# Patient Record
Sex: Male | Born: 2005 | Race: White | Hispanic: No | Marital: Single | Smoking: Never smoker
Health system: Southern US, Community
[De-identification: ages and names within clinical notes are randomized; demographics above are authoritative.]

## PROBLEM LIST (undated history)

## (undated) ENCOUNTER — Ambulatory Visit: Admission: EM | Payer: 59 | Source: Home / Self Care

---
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
## 2023-10-06 IMAGING — CR DG CHEST 2V
2 series · 2 of 2 positions shown · non-contrast
Comparison: 12/06/2009

CLINICAL DATA: Worsening asthma

EXAM:
CHEST - 2 VIEW

[chest pa]
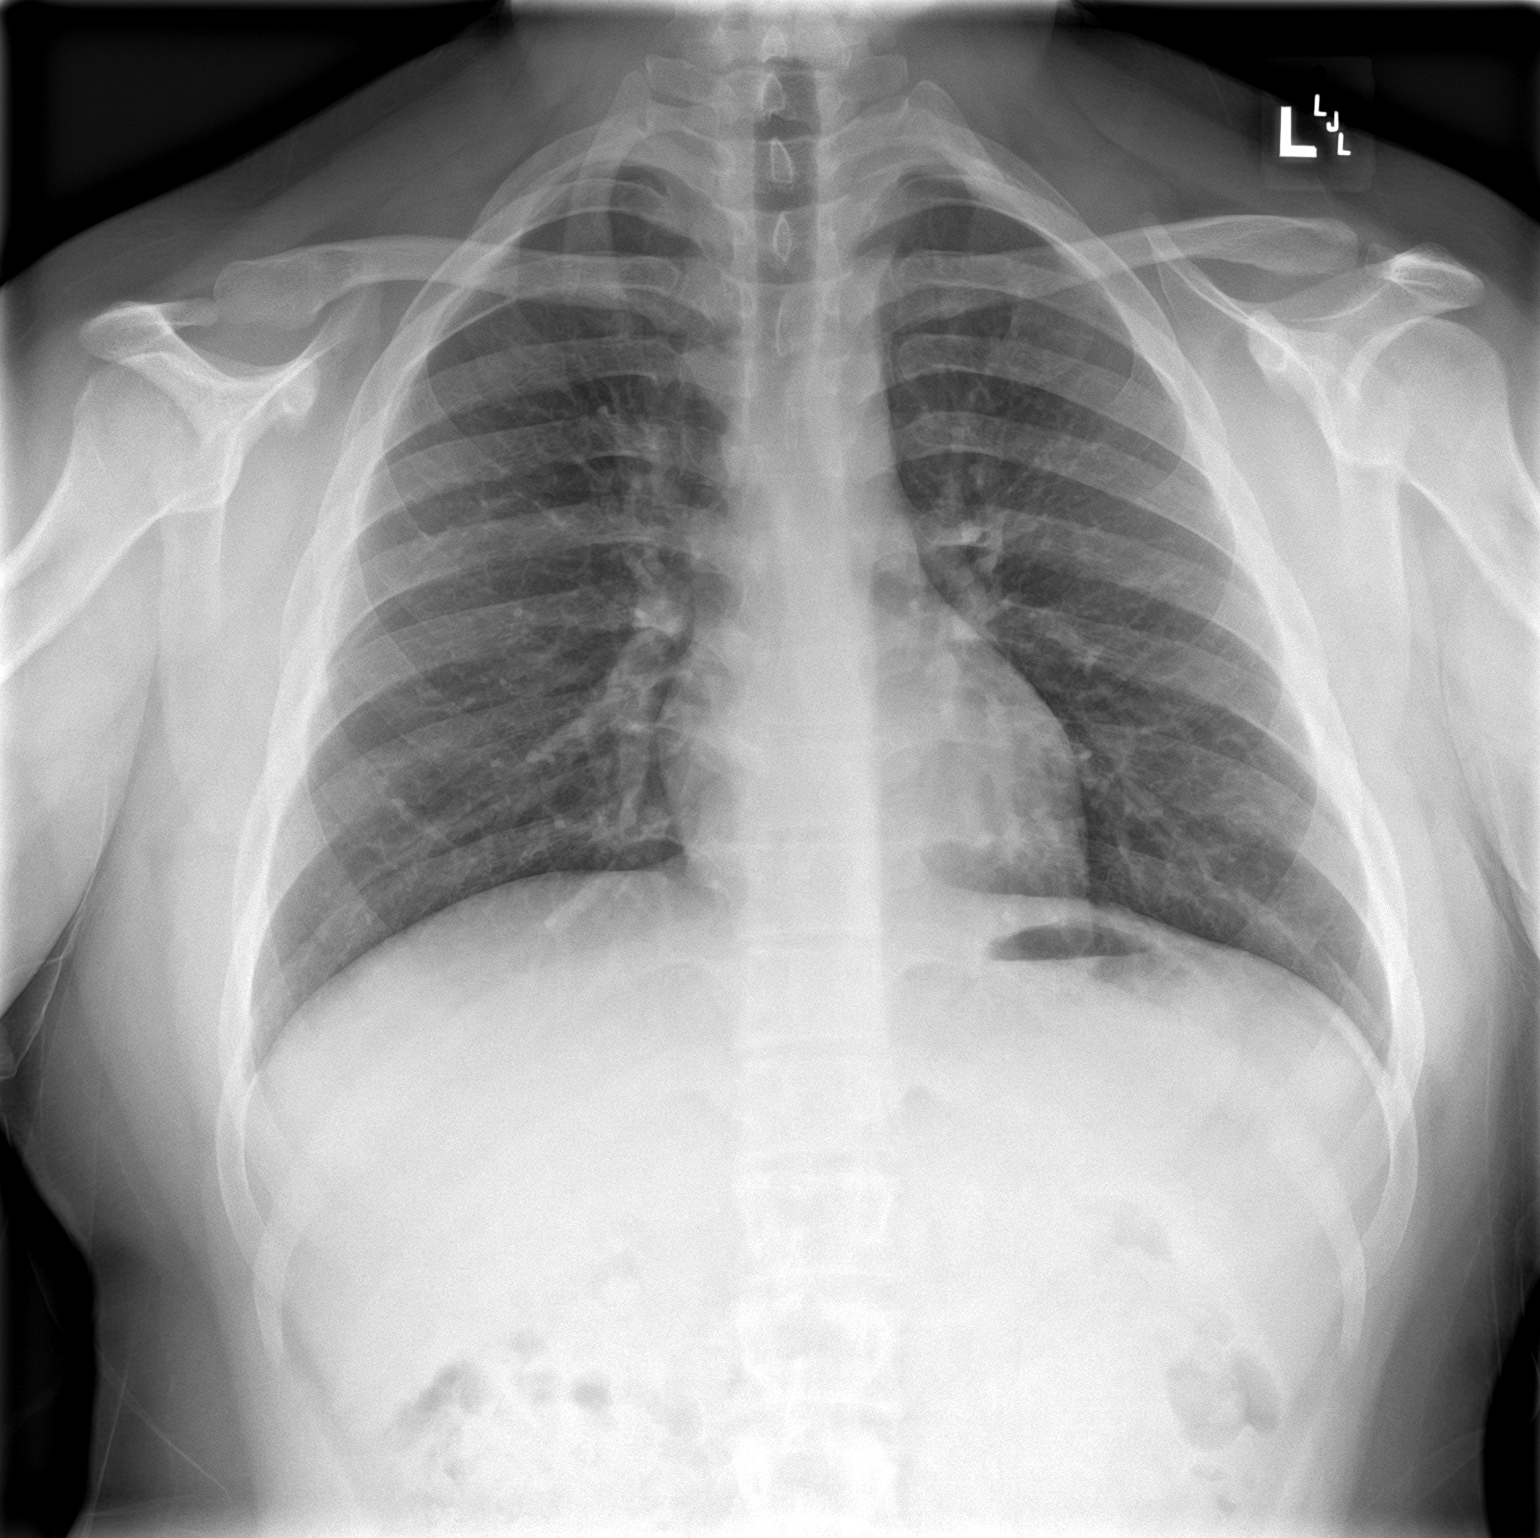

[chest lat]
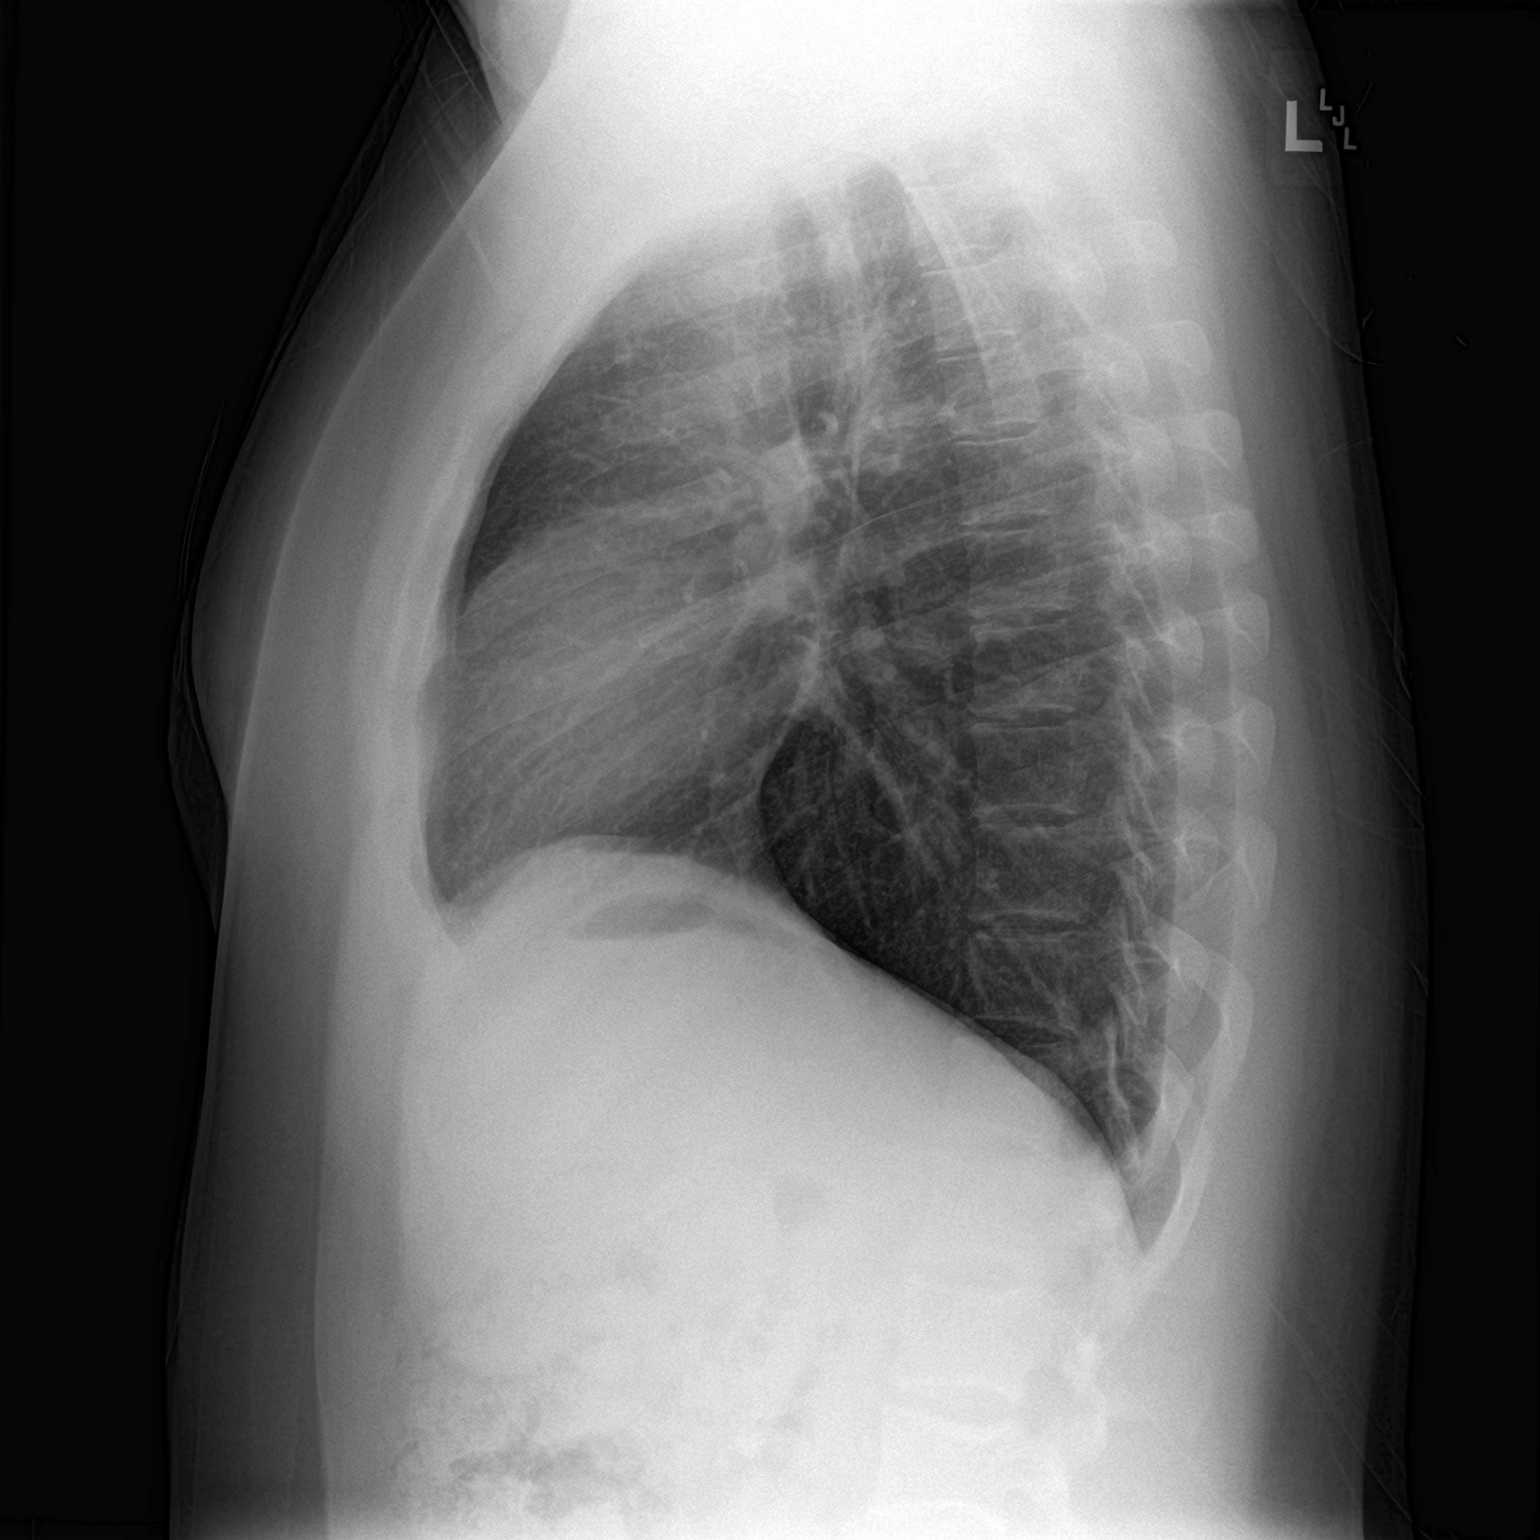

[2 of 2 positions shown; findings below may reference images not displayed]

FINDINGS: The heart size and mediastinal contours are within normal limits.
Both lungs are clear. The visualized skeletal structures are
unremarkable.
IMPRESSION: No active cardiopulmonary disease.

## 2023-10-20 ENCOUNTER — Ambulatory Visit (INDEPENDENT_AMBULATORY_CARE_PROVIDER_SITE_OTHER): Admitting: Psychiatry

## 2023-10-20 ENCOUNTER — Encounter: Payer: Self-pay | Admitting: Psychiatry

## 2023-10-20 VITALS — BP 118/78 | HR 82 | Temp 98.1°F | Ht 62.8 in | Wt 155.0 lb

## 2023-10-20 DIAGNOSIS — F99 Mental disorder, not otherwise specified: Secondary | ICD-10-CM | POA: Diagnosis not present

## 2023-10-20 DIAGNOSIS — F900 Attention-deficit hyperactivity disorder, predominantly inattentive type: Secondary | ICD-10-CM | POA: Diagnosis not present

## 2023-10-20 DIAGNOSIS — F5105 Insomnia due to other mental disorder: Secondary | ICD-10-CM | POA: Diagnosis not present

## 2023-10-20 MED ORDER — AMPHETAMINE-DEXTROAMPHETAMINE 5 MG PO TABS: 5.0000 mg | ORAL_TABLET | Freq: Every day | ORAL | 0 refills | Status: DC

## 2023-10-20 MED ORDER — TRAZODONE HCL 50 MG PO TABS: 50.0000 mg | ORAL_TABLET | Freq: Every day | ORAL | 0 refills | Status: DC

## 2023-10-20 NOTE — Progress Notes (Signed)
 Psychiatric Initial Adult Assessment   Patient Identification: Vernon Shelton MRN:  [id number] Date of Evaluation:  10/20/2023 Referral Source: Erick Colace MD Chief Complaint:   Chief Complaint  Patient presents with   Establish Care   Visit Diagnosis:    ICD-10-CM   1. Attention deficit hyperactivity disorder (ADHD), predominantly inattentive type  F90.0       History of Present Illness:  18 year old male presenting to [facility] for medication management and establishing care.  Patient has [facility] Oncologist who is Transport planner and reports that he is having poor concentration as well as anxiety.  Patient did score 13 on PHQ-9 as well as a 9 on GAD-7 but appeared to be contributing due to the poor concentration.  Patient reports that he is having symptoms of forgetting things easily as well as easily getting distracted, poor attentiveness and difficulty in staying on topic on topics of no interest.  Patient and his mother report that he is getting ready to go to internship in which she requires a great focus as well as energy to get through and is considering stimulant usage to help manage ADHD.  Patient is nave to ADHD medications and has not taken any nonstimulants or stimulants.  Based on assessment today patient reports that he would like to consider stimulants with understanding the risk and benefits regarding the possible dependence as well as the potential for abuse.  Patient has been instructed that if stimulant prescription is given that he must take it as prescribed.  In which he is in agreement.  Patient completed neuropsych testing on Nov 04, 2022 [facility].  The provider psychologist Linus Orn conducted a neuropsych testing including clinical intake interview, personality assessment inventory, generalized anxiety disorder screener, Conners 3 adult rating subscale and observer, behavior rating inventory of executive function adult and observer,  and Conners continuous performance test.  After evaluation patient was diagnosed with ADHD, predominantly inattentive type with anxiety by the psychologist.    Based on this assessment and interview it is recommended for the patient to be diagnosed with ADHD with inattentive type.  Patient is recommended to start on Adderall IR 5 mg once daily in the morning, to assess the response to medications and to see how long the medication is effective in the perspective of the patient.  Patient is nave to stimulants and Yasin be evaluated regularly to determine the appropriate medication to provide support in patient's studies and work.  Patient denies SI, HI, AVH.  Patient at this time is not appropriate for therapy, and is recommended for the patient to follow up in 2 weeks to determine patient medication effectiveness.  Patient with no questions or concerns patient has been provided education on stimulant usage as well as safety.  Patient has been instructed to please take only medication as prescribed and to not take more and notify the provider should he require more medications on the next visit.  Patient has been instructed should symptoms worsen prior to the next visit to please call or utilize my chart.  Patient verbalized understanding and agreement.  Associated Signs/Symptoms: Depression Symptoms: Negative (Hypo) Manic Symptoms:  Distractibility, Impulsivity, Anxiety Symptoms: Negative Psychotic Symptoms: Negative PTSD Symptoms: Negative  Past Psychiatric History: Previous Psych Hospitalizations: Denies  Outpatient treatment: ADHD neuropsych testing performed by [facility] psychological and wellness.  Medications Current: - No current medications  Medication Trials: - No current medication trials  Suicide & Violence: - Denies SI, HI, AVH Psychotherapy: -  Not currently participating in therapy Legal:  -Denies Previous Psychotropic Medications: No   Substance Abuse History in the last 12  months:  No.  Consequences of Substance Abuse: NA  Past Medical History: History reviewed. No pertinent past medical history. History reviewed. No pertinent surgical history.  Family Psychiatric History: States ADHD symptoms and mother.  Family History: History reviewed. No pertinent family history.  Social History:   Social History   Socioeconomic History   Marital status: Single    Spouse name: Not on file   Number of children: Not on file   Years of education: Not on file   Highest education level: Associate degree: academic program  Occupational History   Not on file  Tobacco Use   Smoking status: Never    Passive exposure: Never   Smokeless tobacco: Never  Vaping Use   Vaping status: Never Used  Substance and Sexual Activity   Alcohol use: Not Currently    Comment: social   Drug use: Never   Sexual activity: Not Currently  Other Topics Concern   Not on file  Social History Narrative   Not on file   Social Drivers of Health   Financial Resource Strain: Not on file  Food Insecurity: Not on file  Transportation Needs: Not on file  Physical Activity: Not on file  Stress: Not on file  Social Connections: Not on file    Additional Social History: No additional history  Allergies:  No Known Allergies  Metabolic Disorder Labs: No results found for: "HGBA1C", "MPG" No results found for: "PROLACTIN" No results found for: "CHOL", "TRIG", "HDL", "CHOLHDL", "VLDL", "LDLCALC" No results found for: "TSH"  Therapeutic Level Labs: No results found for: "LITHIUM" No results found for: "CBMZ" No results found for: "VALPROATE"  Current Medications: Current Outpatient Medications  Medication Sig Dispense Refill   albuterol (VENTOLIN HFA) 108 (90 Base) MCG/ACT inhaler Inhale into the lungs.     amphetamine-dextroamphetamine (ADDERALL) 5 MG tablet Take 1 tablet (5 mg total) by mouth daily with breakfast. 14 tablet 0   traZODone (DESYREL) 50 MG tablet Take 1 tablet  (50 mg total) by mouth at bedtime. 14 tablet 0   No current facility-administered medications for this visit.    Musculoskeletal: Strength & Muscle Tone: within normal limits Gait & Station: normal Patient leans: N/A  Psychiatric Specialty Exam: Review of Systems  Constitutional: Negative.   HENT: Negative.    Eyes: Negative.   Respiratory: Negative.    Cardiovascular: Negative.   Gastrointestinal: Negative.   Endocrine: Negative.   Genitourinary: Negative.   Musculoskeletal: Negative.   Skin: Negative.   Allergic/Immunologic: Negative.   Neurological: Negative.   Hematological: Negative.   Psychiatric/Behavioral:  Positive for decreased concentration. The patient is nervous/anxious.     Blood pressure 118/78, pulse 82, temperature 98.1 F (36.7 C), temperature source Temporal, height 5' 2.8" (1.595 m), weight 155 lb (70.3 kg), SpO2 99%.Body mass index is 27.64 kg/m.  General Appearance: Casual  Eye Contact:  Good  Speech:  Clear and Coherent  Volume:  Normal  Mood:  Anxious  Affect:  Appropriate  Thought Process:  Coherent  Orientation:  Full (Time, Place, and Person)  Thought Content:  Logical  Suicidal Thoughts:  No  Homicidal Thoughts:  No  Memory:  Immediate;   Good Recent;   Good Remote;   Good  Judgement:  Good  Insight:  Good  Psychomotor Activity:  Normal  Concentration:  Concentration: Good and Attention Span: Good  Recall:  Good  Fund of Knowledge:Good  Language: Good  Akathisia:  No  Handed:  Right  AIMS (if indicated):    Assets:  Desire for Improvement Financial Resources/Insurance Vocational/Educational  ADL's:  Intact  Cognition: WNL  Sleep:  Good   Screenings: PHQ2-9    Flowsheet Row Office Visit from [facility]20/2023 in [facility]  PHQ-2 Total Score 3  PHQ-9 Total Score 13      Flowsheet Row Office Visit from 10/20/2023 in [facility]  C-SSRS RISK  CATEGORY No Risk       Assessment and Plan: Assessment - Diagnosis: Attention deficit hyperactivity disorder (ADHD), predominantly inattentive type [F90.0]  Differential Diagnosis: Bipolar Disorder  - Progress: Baseline Appointment - Risk Factors: Stimulant abuse, worsening symptoms  Plan - Medications:  Start Adderall IR 5 mg once daily in the morning, for 2 weeks to monitor patient response.  Patient has been educated on the risk of dependence and abuse.  Patient instructed to take as only prescribed.  Patient also educated on behavior modifications as well strategies to better manage ADHD. Start trazodone 50 mg once daily at night for sleep.  Patient has been educated on sedation of medication and in instructed to take before bed. - Psychotherapy: Not currently recommended for psychotherapy - Education: Patient educated on stimulant use as well as safety and risk for abuse.  Patient educated on behavioral modifications for ADHD.  Patient instructed to only take medication as prescribed. - Follow-Up: Patient Damean follow up in 2 weeks - Referrals: No referrals - Safety Planning: The patient has been educated, if they should have suicidal thoughts with or without a plan to call 911, or go to the closest emergency department.  Pt verbalized understanding.  Pt denies firearms within the home.  Pt also agrees to call the clinic should they have worsening symptoms before the next appointment.   Patient/Guardian was advised Release of Information must be obtained prior to any record release in order to collaborate their care with an outside provider. Patient/Guardian was advised if they have not already done so to contact the registration department to sign all necessary forms in order for us  to release information regarding their care.   Consent: Patient/Guardian gives verbal consent for treatment and assignment of benefits for services provided during this visit. Patient/Guardian expressed  understanding and agreed to proceed.   This office note has been dictated. This dictation was prepared using Air traffic controller. As a result, errors may occur. When identified, these errors have been corrected. While every attempt is made to correct errors during dictation, errors may still exist.   Arlana Labor, NP 4/17/20259:21 AM

## 2023-11-02 ENCOUNTER — Encounter: Payer: Self-pay | Admitting: Psychiatry

## 2023-11-02 ENCOUNTER — Ambulatory Visit (INDEPENDENT_AMBULATORY_CARE_PROVIDER_SITE_OTHER): Admitting: Psychiatry

## 2023-11-02 ENCOUNTER — Other Ambulatory Visit: Payer: Self-pay

## 2023-11-02 VITALS — BP 111/68 | HR 70 | Temp 98.4°F | Ht 62.8 in | Wt 154.4 lb

## 2023-11-02 DIAGNOSIS — F5105 Insomnia due to other mental disorder: Secondary | ICD-10-CM

## 2023-11-02 DIAGNOSIS — F99 Mental disorder, not otherwise specified: Secondary | ICD-10-CM | POA: Diagnosis not present

## 2023-11-02 DIAGNOSIS — F900 Attention-deficit hyperactivity disorder, predominantly inattentive type: Secondary | ICD-10-CM | POA: Diagnosis not present

## 2023-11-02 DIAGNOSIS — G4700 Insomnia, unspecified: Secondary | ICD-10-CM | POA: Insufficient documentation

## 2023-11-02 MED ORDER — TRAZODONE HCL 50 MG PO TABS: 50.0000 mg | ORAL_TABLET | Freq: Every evening | ORAL | 2 refills | Status: DC | PRN

## 2023-11-02 MED ORDER — AMPHETAMINE-DEXTROAMPHETAMINE 10 MG PO TABS: 10.0000 mg | ORAL_TABLET | Freq: Every day | ORAL | 0 refills | Status: DC

## 2023-11-02 MED ORDER — QUILLICHEW ER 20 MG PO CHER: 20.0000 mg | CHEWABLE_EXTENDED_RELEASE_TABLET | Freq: Every day | ORAL | 0 refills | Status: DC

## 2023-11-02 NOTE — Addendum Note (Signed)
 Addended by: Gerarda Conklin on: 11/02/2023 02:46 PM   Modules accepted: Orders
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
## 2023-11-16 ENCOUNTER — Telehealth (INDEPENDENT_AMBULATORY_CARE_PROVIDER_SITE_OTHER): Admitting: Psychiatry

## 2023-11-16 DIAGNOSIS — F5105 Insomnia due to other mental disorder: Secondary | ICD-10-CM | POA: Diagnosis not present

## 2023-11-16 DIAGNOSIS — F900 Attention-deficit hyperactivity disorder, predominantly inattentive type: Secondary | ICD-10-CM | POA: Diagnosis not present

## 2023-11-16 DIAGNOSIS — F99 Mental disorder, not otherwise specified: Secondary | ICD-10-CM

## 2023-11-16 MED ORDER — TRAZODONE HCL 50 MG PO TABS: 50.0000 mg | ORAL_TABLET | Freq: Every evening | ORAL | 2 refills | Status: DC | PRN

## 2023-11-16 MED ORDER — AMPHETAMINE-DEXTROAMPHETAMINE 10 MG PO TABS: 10.0000 mg | ORAL_TABLET | Freq: Every day | ORAL | 0 refills | Status: DC
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
## 2023-11-17 ENCOUNTER — Encounter: Payer: Self-pay | Admitting: Psychiatry

## 2024-02-16 ENCOUNTER — Telehealth (INDEPENDENT_AMBULATORY_CARE_PROVIDER_SITE_OTHER): Admitting: Psychiatry

## 2024-02-16 DIAGNOSIS — F5105 Insomnia due to other mental disorder: Secondary | ICD-10-CM | POA: Diagnosis not present

## 2024-02-16 DIAGNOSIS — F900 Attention-deficit hyperactivity disorder, predominantly inattentive type: Secondary | ICD-10-CM

## 2024-02-16 DIAGNOSIS — F99 Mental disorder, not otherwise specified: Secondary | ICD-10-CM | POA: Diagnosis not present

## 2024-02-16 MED ORDER — AMPHETAMINE-DEXTROAMPHETAMINE 10 MG PO TABS: 10.0000 mg | ORAL_TABLET | Freq: Every day | ORAL | 0 refills | Status: AC

## 2024-02-16 MED ORDER — TRAZODONE HCL 50 MG PO TABS: 50.0000 mg | ORAL_TABLET | Freq: Every evening | ORAL | 2 refills | Status: AC | PRN
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
## 2024-05-17 ENCOUNTER — Telehealth: Admitting: Psychiatry

## 2024-05-17 DIAGNOSIS — F900 Attention-deficit hyperactivity disorder, predominantly inattentive type: Secondary | ICD-10-CM

## 2024-05-17 DIAGNOSIS — F5105 Insomnia due to other mental disorder: Secondary | ICD-10-CM | POA: Diagnosis not present

## 2024-05-17 DIAGNOSIS — F99 Mental disorder, not otherwise specified: Secondary | ICD-10-CM
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
